# Patient Record
Sex: Female | Born: 2005 | Race: Black or African American | Hispanic: No | Marital: Single | Smoking: Never smoker
Health system: Southern US, Community
[De-identification: ages and names within clinical notes are randomized; demographics above are authoritative.]

## PROBLEM LIST (undated history)

## (undated) DIAGNOSIS — J45909 Unspecified asthma, uncomplicated: Secondary | ICD-10-CM

---
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
## 2023-04-10 ENCOUNTER — Other Ambulatory Visit: Payer: Self-pay

## 2023-04-10 ENCOUNTER — Emergency Department (HOSPITAL_BASED_OUTPATIENT_CLINIC_OR_DEPARTMENT_OTHER)
Admission: EM | Admit: 2023-04-10 | Discharge: 2023-04-10 | Disposition: A | Discharge status: Home / Self Care | Payer: Medicaid Other | Attending: Emergency Medicine | Admitting: Emergency Medicine

## 2023-04-10 ENCOUNTER — Encounter (HOSPITAL_BASED_OUTPATIENT_CLINIC_OR_DEPARTMENT_OTHER): Payer: Self-pay | Admitting: Emergency Medicine

## 2023-04-10 DIAGNOSIS — L02214 Cutaneous abscess of groin: Secondary | ICD-10-CM | POA: Diagnosis present

## 2023-04-10 DIAGNOSIS — B9689 Other specified bacterial agents as the cause of diseases classified elsewhere: Secondary | ICD-10-CM

## 2023-04-10 DIAGNOSIS — N760 Acute vaginitis: Secondary | ICD-10-CM | POA: Diagnosis not present

## 2023-04-10 LAB — WET PREP, GENITAL
Sperm: NONE SEEN
Trich, Wet Prep: NONE SEEN
WBC, Wet Prep HPF POC: <10 (ref <10)
Yeast Wet Prep HPF POC: NONE SEEN

## 2023-04-10 LAB — URINALYSIS, ROUTINE W REFLEX MICROSCOPIC
Bilirubin Urine: NEGATIVE
Glucose, UA: NEGATIVE mg/dL
Hgb urine dipstick: NEGATIVE
Ketones, ur: NEGATIVE mg/dL
Leukocytes,Ua: NEGATIVE
Nitrite: NEGATIVE
Protein, ur: NEGATIVE mg/dL
Specific Gravity, Urine: <1.005 — ABNORMAL LOW (ref 1.005–1.030)
pH: 6 (ref 5.0–8.0)

## 2023-04-10 LAB — PREGNANCY, URINE: Preg Test, Ur: NEGATIVE

## 2023-04-10 MED ORDER — MUPIROCIN 2 % EX OINT: 1.0000 | TOPICAL_OINTMENT | Freq: Two times a day (BID) | CUTANEOUS | 0 refills | Status: AC

## 2023-04-10 MED ORDER — LIDOCAINE-EPINEPHRINE (PF) 2 %-1:200000 IJ SOLN
10.0000 mL | Freq: Once | INTRAMUSCULAR | Status: DC
  Filled 2023-04-10: qty 20

## 2023-04-10 MED ORDER — METRONIDAZOLE 500 MG PO TABS: 500.0000 mg | ORAL_TABLET | Freq: Two times a day (BID) | ORAL | 0 refills | Status: AC

## 2023-04-10 NOTE — Discharge Instructions (Signed)
Apply the mupirocin ointment to the resolving abscess on the groin area.  Take the metronidazole tablets for presumed bacterial vaginosis.  Testing for gonorrhea and chlamydia will return in the next 48 hours.  You should be notified with any positive results, but results will be available on your MyChart.

## 2023-04-10 NOTE — ED Notes (Signed)
Pt discharged to home using teachback Method. Discharge instructions have been discussed with patient and/or family members. Pt verbally acknowledges understanding d/c instructions, has been given opportunity for questions to be answered, and endorses comprehension to checkout at registration before leaving.  

## 2023-04-10 NOTE — ED Triage Notes (Signed)
Pt has a boil on labia, has been there for 2 days, it did pop and had some exudate. It is still there, hard and painful, no fevers.

## 2023-04-10 NOTE — ED Provider Notes (Signed)
[place] EMERGENCY DEPARTMENT AT [facility] Provider Note   CSN: [id number] Arrival date & time: 04/10/23  1010     History  Chief Complaint  Patient presents with   Abscess   SEXUALLY TRANSMITTED DISEASE    Diane Hess is a 17 y.o. female.  Patient presents emergency department today for 2 complaints.  One is that she has a boil on her labia that has been present for approximately 2 days.  Initially was larger and it opened up and drained somewhat but is still painful.  Patient also complains of a change in her "pH balance".  She reports a vaginal discharge with odor.  She is concerned about a sexually transmitted infection.  No dysuria, increased frequency or urgency, hematuria.  No abdominal pain.  No fevers or vomiting.       Home Medications Prior to Admission medications   Medication Sig Start Date End Date Taking? Authorizing Provider  albuterol (VENTOLIN HFA) 108 (90 Base) MCG/ACT inhaler Inhale 2 puffs into the lungs every 4 (four) hours as needed for wheezing or shortness of breath. 10/02/22   Zenia Resides, MD  cetirizine (ZYRTEC) 5 MG tablet Take 1 tablet (5 mg total) by mouth daily. 11/07/15   Erasmo Downer, MD  cetirizine HCl (ZYRTEC) 5 MG/5ML SYRP Take 5 mLs (5 mg total) by mouth at bedtime. 05/07/15   Erasmo Downer, MD  fluticasone (FLOVENT HFA) 110 MCG/ACT inhaler Inhale 2 puffs into the lungs 2 (two) times daily. 11/12/16   Erasmo Downer, MD  Spacer/Aero-Holding Chambers (E-Z SPACER) inhaler Use as instructed 03/20/16   Erasmo Downer, MD  triamcinolone ointment (KENALOG) 0.5 % Apply 1 application topically 2 (two) times daily. 03/20/16   Erasmo Downer, MD      Allergies    Patient has no known allergies.    Review of Systems   Review of Systems  Physical Exam Updated Vital Signs BP (!) 162/87   Pulse 70   Temp 99.3 F (37.4 C) (Oral)   Resp 20   Wt 77.1 kg   SpO2 100%   Breastfeeding No   BMI 28.29 kg/m   Physical Exam Vitals and nursing note reviewed. Exam conducted with a chaperone present.  Constitutional:      Appearance: She is well-developed.  HENT:     Head: Normocephalic and atraumatic.  Eyes:     Conjunctiva/sclera: Conjunctivae normal.  Pulmonary:     Effort: No respiratory distress.  Genitourinary:    Exam position: Lithotomy position.     Comments: Small resolving abscess, right groin, without fluctuance or induration.  Minimal tenderness.  No active drainage.  Labia appear normal. Musculoskeletal:     Cervical back: Normal range of motion and neck supple.  Skin:    General: Skin is warm and dry.  Neurological:     Mental Status: She is alert.     ED Results / Procedures / Treatments   Labs (all labs ordered are listed, but only abnormal results are displayed) Labs Reviewed  URINALYSIS, ROUTINE W REFLEX MICROSCOPIC  PREGNANCY, URINE  GC/CHLAMYDIA PROBE AMP ([place]) NOT AT [facility]    EKG None  Radiology No results found.  Procedures Procedures    Medications Ordered in ED Medications  lidocaine-EPINEPHrine (XYLOCAINE W/EPI) 2 %-1:200000 (PF) injection 10 mL (10 mLs Infiltration Not Given 04/10/23 1119)    ED Course/ Medical Decision Making/ A&P    Patient seen and examined. History obtained directly from patient.  I  offered pelvic exam versus self swabs.  Patient states that she had a pelvic exam when she was pregnant, however this was too painful and would prefer to do the self swab.  We will get her up in the stirrups however to evaluate her labial abscess.  I discussed I&D procedure in case this is recommended after I am able to evaluate her abscess.  Labs/EKG: Ordered wet prep, GC chlamydia, pregnancy, UA  Imaging: None ordered  Medications/Fluids: Ordered: Lidocaine 2% with epinephrine if needed  Most recent vital signs reviewed and are as follows: BP (!) 162/87   Pulse 70   Temp 99.3 F (37.4 C) (Oral)   Resp 20   Wt 77.1 kg   SpO2  100%   Breastfeeding No   BMI 28.29 kg/m   Initial impression: Labial abscess, vaginal discharge  12:01 PM Reassessment performed. Patient appears stable.  Labs personally reviewed and interpreted including: Wet prep with clue cells otherwise unremarkable.  GC and chlamydia pending.  UA without compelling signs of infection.  Pregnancy negative.  Reviewed pertinent lab work and imaging with patient at bedside. Questions answered.   Most current vital signs reviewed and are as follows: BP (!) 162/87   Pulse 70   Temp 99.3 F (37.4 C) (Oral)   Resp 20   Wt 77.1 kg   SpO2 100%   Breastfeeding No   BMI 28.29 kg/m   Plan: Discharge to home.   Prescriptions written for: Bactroban to use unresolving abscess, metronidazole to treat for bacterial vaginosis  ED return instructions discussed: New or worsening symptoms  Follow-up instructions discussed: Patient encouraged to follow-up with their GYN for any nonresolving symptoms.                            Medical Decision Making Amount and/or Complexity of Data Reviewed Labs: ordered.  Risk Prescription drug management.   Patient here with a small groin abscess.  This is already opened up and appears to be resolving.  No fluctuance today.  After discussion with patient, will defer I&D as this would likely be not very beneficial.  She will try topical antibiotics until resolution.  No surrounding erythema.  She also reports vaginal discharge and odor.  She opted to self swab today.  Wet prep shows clue cells and will treat for clinical bacterial vaginosis.         Final Clinical Impression(s) / ED Diagnoses Final diagnoses:  Cutaneous abscess of groin  Bacterial vaginosis    Rx / DC Orders ED Discharge Orders          Ordered    metroNIDAZOLE (FLAGYL) 500 MG tablet  2 times daily        04/10/23 1158    mupirocin ointment (BACTROBAN) 2 %  2 times daily        04/10/23 1159              Renne Crigler,  PA-C 04/10/23 1202    Margarita Grizzle, MD 04/12/23 [phone]

## 2023-04-10 NOTE — ED Notes (Signed)
Pt is asking for STD test as well, had unprotected sex and has foul odor.

## 2023-04-11 ENCOUNTER — Other Ambulatory Visit: Payer: Self-pay | Admitting: Family Medicine

## 2023-04-12 LAB — GC/CHLAMYDIA PROBE AMP (~~LOC~~) NOT AT ARMC
Chlamydia: POSITIVE — AB
Comment: NEGATIVE
Comment: NORMAL
Neisseria Gonorrhea: POSITIVE — AB

## 2023-04-13 ENCOUNTER — Other Ambulatory Visit (HOSPITAL_BASED_OUTPATIENT_CLINIC_OR_DEPARTMENT_OTHER): Payer: Self-pay

## 2024-02-16 ENCOUNTER — Emergency Department (HOSPITAL_COMMUNITY)
Admission: EM | Admit: 2024-02-16 | Discharge: 2024-02-17 | Disposition: A | Discharge status: Home / Self Care | Attending: Emergency Medicine | Admitting: Emergency Medicine

## 2024-02-16 ENCOUNTER — Emergency Department (HOSPITAL_COMMUNITY)

## 2024-02-16 ENCOUNTER — Other Ambulatory Visit: Payer: Self-pay

## 2024-02-16 DIAGNOSIS — S0990XA Unspecified injury of head, initial encounter: Secondary | ICD-10-CM | POA: Diagnosis present

## 2024-02-16 DIAGNOSIS — M549 Dorsalgia, unspecified: Secondary | ICD-10-CM | POA: Insufficient documentation

## 2024-02-16 DIAGNOSIS — S299XXA Unspecified injury of thorax, initial encounter: Secondary | ICD-10-CM | POA: Insufficient documentation

## 2024-02-16 DIAGNOSIS — R1031 Right lower quadrant pain: Secondary | ICD-10-CM | POA: Insufficient documentation

## 2024-02-16 DIAGNOSIS — Y92410 Unspecified street and highway as the place of occurrence of the external cause: Secondary | ICD-10-CM | POA: Insufficient documentation

## 2024-02-16 LAB — CBC WITH DIFFERENTIAL/PLATELET

## 2024-02-16 LAB — COMPREHENSIVE METABOLIC PANEL WITH GFR
ALT: 16 U/L (ref 0–44)
AST: 22 U/L (ref 15–41)
Albumin: 3.4 g/dL — ABNORMAL LOW (ref 3.5–5.0)
Alkaline Phosphatase: 34 U/L — ABNORMAL LOW (ref 38–126)
Anion gap: 7 (ref 5–15)
BUN: 10 mg/dL (ref 6–20)
CO2: 24 mmol/L (ref 22–32)
Calcium: 9 mg/dL (ref 8.9–10.3)
Chloride: 111 mmol/L (ref 98–111)
Creatinine, Ser: 0.89 mg/dL (ref 0.44–1.00)
GFR, Estimated: >60 mL/min (ref >60)
Glucose, Bld: 88 mg/dL (ref 70–99)
Potassium: 3.7 mmol/L (ref 3.5–5.1)
Sodium: 142 mmol/L (ref 135–145)
Total Bilirubin: 0.4 mg/dL (ref 0.0–1.2)
Total Protein: 6.6 g/dL (ref 6.5–8.1)

## 2024-02-16 LAB — PREGNANCY, URINE: Preg Test, Ur: NEGATIVE

## 2024-02-16 MED ORDER — IOHEXOL 350 MG/ML SOLN
75.0000 mL | Freq: Once | INTRAVENOUS | Status: AC | PRN
Start: 2024-02-16 — End: 2024-02-16
  Administered 2024-02-16: 75 mL via INTRAVENOUS

## 2024-02-16 MED ORDER — ONDANSETRON HCL 4 MG/2ML IJ SOLN
4.0000 mg | Freq: Once | INTRAMUSCULAR | Status: AC
  Administered 2024-02-16: 4 mg via INTRAVENOUS
  Filled 2024-02-16: qty 2

## 2024-02-16 MED ORDER — MORPHINE SULFATE (PF) 4 MG/ML IV SOLN
4.0000 mg | Freq: Once | INTRAVENOUS | Status: AC
  Administered 2024-02-16: 4 mg via INTRAVENOUS
  Filled 2024-02-16: qty 1
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
## 2024-02-17 LAB — CBC WITH DIFFERENTIAL/PLATELET
Basophils Relative: 0.1 10*3/uL (ref 0.0–0.1)
Eosinophils Absolute: 1 10*3/uL (ref 0.0–0.5)
Eosinophils Relative: 0.2 10*3/uL (ref 0.0–0.5)
HCT: 32.7 % — ABNORMAL LOW (ref 36.0–46.0)
Hemoglobin: 9.7 g/dL — ABNORMAL LOW (ref 12.0–15.0)
Immature Granulocytes: 0 10*3/uL (ref 0.0–0.1)
Lymphocytes Relative: 30 %
Lymphs Abs: 2.4 10*3/uL (ref 0.7–4.0)
MCH: 20 pg — ABNORMAL LOW (ref 26.0–34.0)
MCHC: 29.7 g/dL — ABNORMAL LOW (ref 30.0–36.0)
MCV: 67.4 fL — ABNORMAL LOW (ref 80.0–100.0)
Monocytes Absolute: 3 10*3/uL (ref 0.1–1.0)
Monocytes Relative: 0.5 10*3/uL (ref 0.1–1.0)
Monocytes Relative: 6 10*3/uL (ref 0.7–4.0)
Neutro Abs: 5 10*3/uL (ref 1.7–7.7)
Neutrophils Relative %: 60 %
Platelets: 294 10*3/uL (ref 150–400)
RBC: 4.85 MIL/uL (ref 3.87–5.11)
RDW: 18.7 % — ABNORMAL HIGH (ref 11.5–15.5)
WBC Morphology: 0.02 10*3/uL (ref 0.00–0.07)
WBC: 8.2 10*3/uL (ref 4.0–10.5)
nRBC: 0 % (ref 0.0–0.2)

## 2024-02-17 MED ORDER — CYCLOBENZAPRINE HCL 10 MG PO TABS
10.0000 mg | ORAL_TABLET | Freq: Three times a day (TID) | ORAL | 0 refills | Status: AC | PRN
Start: 2024-02-17 — End: ?

## 2024-02-17 MED ORDER — IBUPROFEN 800 MG PO TABS
800.0000 mg | ORAL_TABLET | Freq: Three times a day (TID) | ORAL | 0 refills | Status: AC
Start: 2024-02-17 — End: ?
